# Patient Record
Sex: Male | Born: 2000 | Race: Black or African American | Hispanic: No | Marital: Single | Smoking: Never smoker
Health system: Southern US, Community
[De-identification: ages and names within clinical notes are randomized; demographics above are authoritative.]

## PROBLEM LIST (undated history)

## (undated) DIAGNOSIS — Q6689 Other  specified congenital deformities of feet: Secondary | ICD-10-CM

## (undated) HISTORY — DX: Other specified congenital deformities of feet: Q66.89

## (undated) HISTORY — PX: FOOT SURGERY: SHX648

---
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
## 2022-07-14 ENCOUNTER — Encounter (HOSPITAL_COMMUNITY): Payer: Self-pay | Admitting: *Deleted

## 2022-07-14 ENCOUNTER — Ambulatory Visit (INDEPENDENT_AMBULATORY_CARE_PROVIDER_SITE_OTHER): Payer: Medicaid Other

## 2022-07-14 ENCOUNTER — Ambulatory Visit (HOSPITAL_COMMUNITY)
Admission: EM | Admit: 2022-07-14 | Discharge: 2022-07-14 | Disposition: A | Discharge status: Home / Self Care | Payer: Medicaid Other | Attending: Nurse Practitioner | Admitting: Nurse Practitioner

## 2022-07-14 ENCOUNTER — Other Ambulatory Visit: Payer: Self-pay

## 2022-07-14 DIAGNOSIS — M79671 Pain in right foot: Secondary | ICD-10-CM

## 2022-07-14 DIAGNOSIS — S93601A Unspecified sprain of right foot, initial encounter: Secondary | ICD-10-CM

## 2022-07-14 MED ORDER — IBUPROFEN 600 MG PO TABS: 600.0000 mg | ORAL_TABLET | Freq: Four times a day (QID) | ORAL | 0 refills | Status: AC | PRN

## 2022-07-14 NOTE — ED Provider Notes (Signed)
[facility]    CSN: [id number] Arrival date & time: 07/14/22  1330      History   Chief Complaint Chief Complaint  Patient presents with   Foot Injury    HPI Craig Kramer is a 22 y.o. male.   Craig Kramer is a 22 y.o. male that complains of pain in the dorsal aspect of the right foot without radiation after a sports injury. Onset of symptoms was abrupt starting 1 day ago.  Patient reports that [facility] [place] [facility] [employer] [facility].  Patient reported immediate pain upon walking on normal [place] [facility].  He also noticed some swelling in the area as well.  He wrapped his foot in Ace bandage and has been using crutches as the pain is worse with weightbearing.  Patient reports that the pain is still present but better today.  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  He denies any symptoms numbness, tingling, weakness, loss of sensation, or loss of motion of the foot. The patient denies other injuries.         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 Authorizing Provider  ibuprofen (ADVIL) 600 MG tablet Take 1 tablet (600 mg total) by mouth every 6 (six) hours as needed for mild pain or moderate pain. 07/14/22  Yes Enrique Sack, FNP    Family History Family History  Problem Relation Age of Onset   Cancer Other    Hypertension Other     Social History Social History   Tobacco Use   Smoking status: Never   Smokeless tobacco: Never  Substance Use Topics   Alcohol use: No    Alcohol/week: 0.0 standard drinks of  alcohol   Drug use: No     Allergies   Patient has no known allergies.   Review of Systems Review of Systems  Musculoskeletal:  Positive for gait problem.       Right foot pain   All other systems reviewed and are negative.    Physical Exam Triage Vital Signs ED Triage Vitals [07/14/22 1516]  Enc Vitals Group     BP 134/76     Pulse Rate 65     Resp 16     Temp 98.3 F (36.8 C)     Temp src      SpO2 98 %     Weight      Height      Head Circumference      Peak Flow      Pain Score      Pain Loc      Pain Edu?      Excl. in [place]?    No data found.  Updated Vital Signs BP 134/76   Pulse 65   Temp 98.3 F (36.8 C)   Resp 16   SpO2 98%   Visual Acuity Right Eye Distance:   Left Eye Distance:   Bilateral Distance:    Right Eye Near:   Left  Eye Near:    Bilateral Near:     Physical Exam Vitals reviewed.  Constitutional:      Appearance: Normal appearance.  HENT:     Head: Normocephalic.  Cardiovascular:     Rate and Rhythm: Normal rate.  Pulmonary:     Effort: Pulmonary effort is normal.  Musculoskeletal:        General: Normal range of motion.     Cervical back: Normal range of motion and neck supple.     Right foot: Normal range of motion and normal capillary refill. Tenderness present. No swelling, deformity or bony tenderness.       Legs:  Feet:     Comments: Old surgical scar noted along the dorsal aspect of the right foot  Skin:    General: Skin is warm and dry.  Neurological:     General: No focal deficit present.     Mental Status: He is alert and oriented to person, place, and time.      UC Treatments / Results  Labs (all labs ordered are listed, but only abnormal results are displayed) Labs Reviewed - No data to display  EKG   Radiology DG Foot Complete Right  Result Date: 07/14/2022 CLINICAL DATA:  Pain EXAM: RIGHT FOOT COMPLETE - 3+ VIEW COMPARISON:  08/22/2019, 10/12/2017 FINDINGS: No fracture or dislocation of the  right foot. Severe flatfoot deformity on non weight-bearing examination. Joint spaces preserved. Soft tissues unremarkable. IMPRESSION: 1. No fracture or dislocation of the right foot. 2. Severe flatfoot deformity on non weight-bearing examination. Electronically Signed   By: Delanna Ahmadi M.D.   On: 07/14/2022 15:33    Procedures Procedures (including critical care time)  Medications Ordered in UC Medications - No data to display  Initial Impression / Assessment and Plan / UC Course  I have reviewed the triage vital signs and the nursing notes.  Pertinent labs & imaging results that were available during my care of the patient were reviewed by me and considered in my medical decision making (see chart for details).    22 yo male presenting with right foot pain after playing basketball at the [facility] one day ago. XR of the foot negative for any fracture or acute abnormalities.  Physical exam as above.  Injury likely due to a sprain.  Patient placed in walker boot.  Motrin as needed.  Follow-up with Ortho if symptoms fail to improve or worsen.  Today's evaluation has revealed no signs of a dangerous process. Discussed diagnosis with patient and/or guardian. Patient and/or guardian aware of their diagnosis, possible red flag symptoms to watch out for and need for close follow up. Patient and/or guardian understands verbal and written discharge instructions. Patient and/or guardian comfortable with plan and disposition.  Patient and/or guardian has a clear mental status at this time, good insight into illness (after discussion and teaching) and has clear judgment to make decisions regarding their care  Documentation was completed with the aid of voice recognition software. Transcription may contain typographical errors. Final Clinical Impressions(s) / UC Diagnoses   Final diagnoses:  Sprain of right foot, initial encounter     Discharge Instructions      XR of your foot did  not show any fracture (break) or any abnormality.   Your injury is likely due to a sprain which is treated conservatively with:   Rest, ice, pressure (compression), and elevation (RICE). Elevation means raising your injured foot. Keeping your foot in a fixed position (immobilization)  for a period of time.  Wear boot to support your foot while it heals. Take medications as needed for pain      ED Prescriptions     Medication Sig Dispense Auth. Provider   ibuprofen (ADVIL) 600 MG tablet Take 1 tablet (600 mg total) by mouth every 6 (six) hours as needed for mild pain or moderate pain. 30 tablet Enrique Sack, FNP      PDMP not reviewed this encounter.   Enrique Sack, [place] 07/14/22 1620

## 2022-07-14 NOTE — ED Triage Notes (Signed)
[place] [employer] [place] and [place] the top of his RT foot. Pt reports decreased movement to RT great toe. Pt presents with crutches.

## 2022-07-14 NOTE — Discharge Instructions (Addendum)
XR of your foot did not show any fracture (break) or any abnormality.   Your injury is likely due to a sprain which is treated conservatively with:   Rest, ice, pressure (compression), and elevation (RICE). Elevation means raising your injured foot. Keeping your foot in a fixed position (immobilization) for a period of time.  Wear boot to support your foot while it heals. Take medications as needed for pain

## 2022-08-06 DIAGNOSIS — M79671 Pain in right foot: Secondary | ICD-10-CM | POA: Diagnosis not present

## 2022-08-06 DIAGNOSIS — M25571 Pain in right ankle and joints of right foot: Secondary | ICD-10-CM | POA: Diagnosis not present

## 2022-08-06 DIAGNOSIS — Q6611 Congenital talipes calcaneovarus, right foot: Secondary | ICD-10-CM | POA: Diagnosis not present

## 2022-08-06 DIAGNOSIS — S92244A Nondisplaced fracture of medial cuneiform of right foot, initial encounter for closed fracture: Secondary | ICD-10-CM | POA: Diagnosis not present

## 2022-09-08 DIAGNOSIS — S92244A Nondisplaced fracture of medial cuneiform of right foot, initial encounter for closed fracture: Secondary | ICD-10-CM | POA: Diagnosis not present

## 2022-09-08 DIAGNOSIS — Q6611 Congenital talipes calcaneovarus, right foot: Secondary | ICD-10-CM | POA: Diagnosis not present

## 2022-09-08 DIAGNOSIS — M25571 Pain in right ankle and joints of right foot: Secondary | ICD-10-CM | POA: Diagnosis not present

## 2022-09-21 DIAGNOSIS — M79671 Pain in right foot: Secondary | ICD-10-CM | POA: Diagnosis not present

## 2022-09-25 DIAGNOSIS — R300 Dysuria: Secondary | ICD-10-CM | POA: Diagnosis not present

## 2022-09-25 DIAGNOSIS — R369 Urethral discharge, unspecified: Secondary | ICD-10-CM | POA: Diagnosis not present

## 2022-10-08 DIAGNOSIS — Q6611 Congenital talipes calcaneovarus, right foot: Secondary | ICD-10-CM | POA: Diagnosis not present

## 2022-10-08 DIAGNOSIS — M19071 Primary osteoarthritis, right ankle and foot: Secondary | ICD-10-CM | POA: Diagnosis not present

## 2022-10-08 DIAGNOSIS — M13871 Other specified arthritis, right ankle and foot: Secondary | ICD-10-CM | POA: Diagnosis not present

## 2022-12-08 ENCOUNTER — Ambulatory Visit: Payer: Medicaid Other | Admitting: Podiatry

## 2022-12-12 DIAGNOSIS — M79675 Pain in left toe(s): Secondary | ICD-10-CM | POA: Diagnosis not present

## 2023-05-06 IMAGING — DX DG FOREARM 2V*L*
2 series · 2 of 2 positions shown · non-contrast
Comparison: None.

CLINICAL DATA: Pain after fall.  Fall on the ground.

EXAM:
LEFT FOREARM - 2 VIEW

[forearm ap]
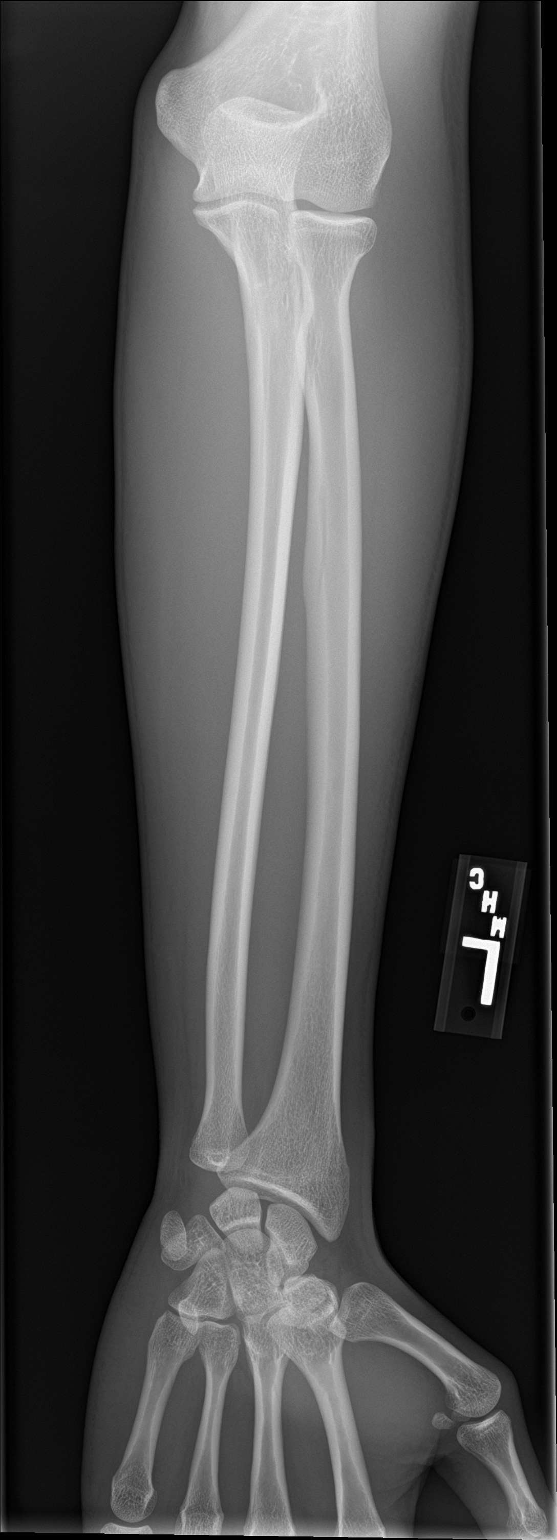

[forearm lat]
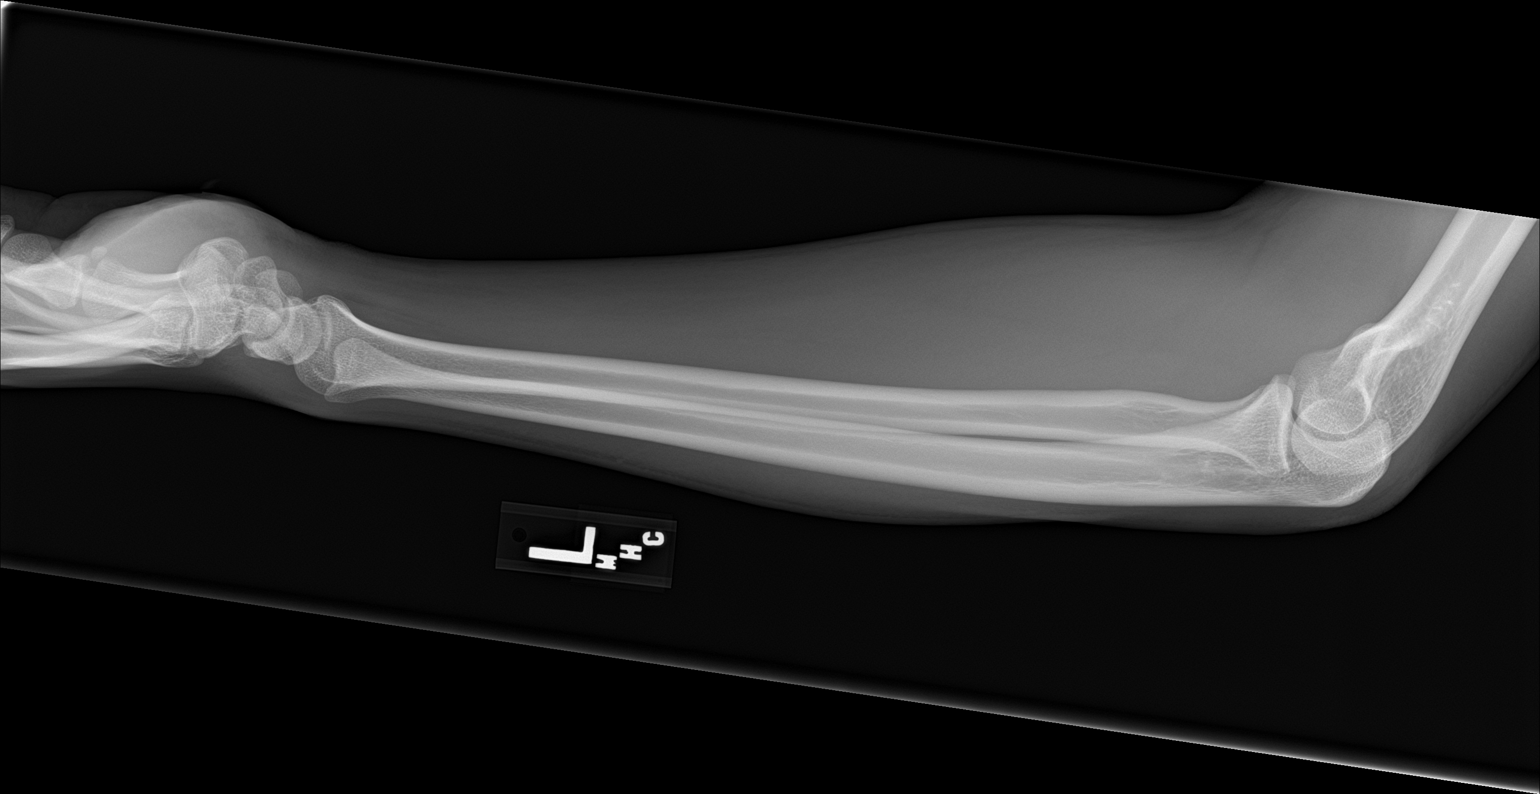

[2 of 2 positions shown; findings below may reference images not displayed]

FINDINGS: The cortical margins of the radius and ulna are intact. There is no
evidence of fracture or other focal bone lesions. Wrist and elbow
alignment are maintained. Soft tissues are unremarkable.
IMPRESSION: Negative radiographs of the left forearm.
